# Patient Record
Sex: Female | Born: 1975 | Race: White | Hispanic: No | Marital: Single | Smoking: Never smoker
Health system: Southern US, Community
[De-identification: ages and names within clinical notes are randomized; demographics above are authoritative.]

## PROBLEM LIST (undated history)

## (undated) DIAGNOSIS — J45909 Unspecified asthma, uncomplicated: Secondary | ICD-10-CM

## (undated) HISTORY — PX: HEEL SPUR SURGERY: SHX665

## (undated) HISTORY — PX: TONSILLECTOMY: SUR1361

## (undated) HISTORY — PX: ABDOMINAL HYSTERECTOMY: SHX81

---

## 2012-09-28 ENCOUNTER — Ambulatory Visit: Payer: Self-pay | Admitting: Family Medicine

## 2017-07-22 ENCOUNTER — Emergency Department: Payer: BLUE CROSS/BLUE SHIELD

## 2017-07-22 ENCOUNTER — Other Ambulatory Visit: Payer: Self-pay

## 2017-07-22 ENCOUNTER — Emergency Department
Admission: EM | Admit: 2017-07-22 | Discharge: 2017-07-22 | Disposition: A | Discharge status: Home / Self Care | Payer: BLUE CROSS/BLUE SHIELD | Attending: Emergency Medicine | Admitting: Emergency Medicine

## 2017-07-22 ENCOUNTER — Encounter: Payer: Self-pay | Admitting: Emergency Medicine

## 2017-07-22 DIAGNOSIS — N200 Calculus of kidney: Secondary | ICD-10-CM | POA: Insufficient documentation

## 2017-07-22 DIAGNOSIS — J45909 Unspecified asthma, uncomplicated: Secondary | ICD-10-CM | POA: Insufficient documentation

## 2017-07-22 DIAGNOSIS — R109 Unspecified abdominal pain: Secondary | ICD-10-CM

## 2017-07-22 DIAGNOSIS — N201 Calculus of ureter: Secondary | ICD-10-CM | POA: Insufficient documentation

## 2017-07-22 DIAGNOSIS — R1084 Generalized abdominal pain: Secondary | ICD-10-CM | POA: Diagnosis present

## 2017-07-22 HISTORY — DX: Unspecified asthma, uncomplicated: J45.909

## 2017-07-22 LAB — URINALYSIS, COMPLETE (UACMP) WITH MICROSCOPIC
BACTERIA UA: NONE SEEN
Bilirubin Urine: NEGATIVE
GLUCOSE, UA: NEGATIVE mg/dL
KETONES UR: 20 mg/dL — AB
LEUKOCYTES UA: NEGATIVE
NITRITE: NEGATIVE
PH: 5 (ref 5.0–8.0)
Protein, ur: 100 mg/dL — AB
Specific Gravity, Urine: 1.025 (ref 1.005–1.030)

## 2017-07-22 MED ORDER — ONDANSETRON 4 MG PO TBDP: 4.0000 mg | ORAL_TABLET | Freq: Three times a day (TID) | ORAL | 0 refills | Status: DC | PRN

## 2017-07-22 MED ORDER — KETOROLAC TROMETHAMINE 30 MG/ML IJ SOLN
15.0000 mg | INTRAMUSCULAR | Status: AC
  Administered 2017-07-22: 15 mg via INTRAVENOUS
  Filled 2017-07-22: qty 1

## 2017-07-22 MED ORDER — TAMSULOSIN HCL 0.4 MG PO CAPS: 0.4000 mg | ORAL_CAPSULE | Freq: Every day | ORAL | 0 refills | Status: DC

## 2017-07-22 MED ORDER — ONDANSETRON HCL 4 MG/2ML IJ SOLN
4.0000 mg | Freq: Once | INTRAMUSCULAR | Status: AC
  Administered 2017-07-22: 4 mg via INTRAVENOUS
  Filled 2017-07-22: qty 2

## 2017-07-22 MED ORDER — TAMSULOSIN HCL 0.4 MG PO CAPS
0.4000 mg | ORAL_CAPSULE | ORAL | Status: AC
  Administered 2017-07-22: 0.4 mg via ORAL
  Filled 2017-07-22: qty 1

## 2017-07-22 MED ORDER — ACETAMINOPHEN 325 MG PO TABS: 650.0000 mg | ORAL_TABLET | Freq: Four times a day (QID) | ORAL | 0 refills | Status: DC | PRN

## 2017-07-22 MED ORDER — SODIUM CHLORIDE 0.9 % IV BOLUS (SEPSIS)
1000.0000 mL | Freq: Once | INTRAVENOUS | Status: AC
  Administered 2017-07-22: 1000 mL via INTRAVENOUS

## 2017-07-22 MED ORDER — IBUPROFEN 200 MG PO TABS: 600.0000 mg | ORAL_TABLET | Freq: Four times a day (QID) | ORAL | 0 refills | Status: DC | PRN

## 2017-07-22 MED ORDER — TRAMADOL HCL 50 MG PO TABS: 50.0000 mg | ORAL_TABLET | Freq: Four times a day (QID) | ORAL | 0 refills | Status: DC | PRN

## 2017-07-22 NOTE — ED Notes (Addendum)
This RN to bedside due to patient calling out. Attempted to hook patient back up to fluids. Pt however states she has to go to the bathroom again. Pt noted to be continuing to moan and groan. Will return to hook back up to fluids.

## 2017-07-22 NOTE — ED Notes (Signed)
Pt returned from CT °

## 2017-07-22 NOTE — ED Notes (Signed)
D/C instructions reviewed by MD. Prescriptions reviewed by MD. Pt scans and results reviewed with MD due to patient asking MD to come to bedside with "pictures in hand". This RN at bedside for review of D/C instructions. Pt denies any comments/concerns at this time. Pt ambulatory to the lobby at this time.

## 2017-07-22 NOTE — ED Provider Notes (Signed)
[facility] Emergency Department Provider Note  ____________________________________________  Time seen: Approximately 8:29 AM  I have reviewed the triage vital signs and the nursing notes.   HISTORY  Chief Complaint Flank Pain    HPI Kristin Khan is a 42 y.o. female who complains of right flank pain, constant, waxing and waning, severe, radiating to right lower quadrant for the past 3 days. No aggravating or alleviating factors. Find a position of comfort. Associated with dysuria and frequency. Never had anything like this before.     Past Medical History:  Diagnosis Date  . Asthma      There are no active problems to display for this patient.    past surgical history includes tonsillectomy and hysterectomy   Prior to Admission medications   Medication Sig Start Date End Date Taking? Authorizing Provider  valACYclovir (VALTREX) 1000 MG tablet Take 1 tablet by mouth daily. 07/15/17  Yes [provider]  acetaminophen (TYLENOL) 325 MG tablet Take 2 tablets (650 mg total) by mouth every 6 (six) hours as needed. 07/22/17   Sharman Cheek, MD  ibuprofen (MOTRIN IB) 200 MG tablet Take 3 tablets (600 mg total) by mouth every 6 (six) hours as needed. 07/22/17   Sharman Cheek, MD  tamsulosin (FLOMAX) 0.4 MG CAPS capsule Take 1 capsule (0.4 mg total) by mouth daily. 07/22/17   Sharman Cheek, MD  traMADol (ULTRAM) 50 MG tablet Take 1 tablet (50 mg total) by mouth every 6 (six) hours as needed. 07/22/17   Sharman Cheek, MD  none   Allergies Other and Oxycodone   No family history on file.  Social History Social History   Tobacco Use  . Smoking status: Never Smoker  . Smokeless tobacco: Never Used  Substance Use Topics  . Alcohol use: Not Currently  . Drug use: Never    Review of Systems  Constitutional:   No fever or chills.  Cardiovascular:   No chest pain or syncope. Respiratory:   No dyspnea or  cough. Gastrointestinal:   Negative for abdominal pain, vomiting and diarrhea.  Musculoskeletal:   Negative for focal pain or swelling All other systems reviewed and are negative except as documented above in ROS and HPI.  ____________________________________________   PHYSICAL EXAM:  VITAL SIGNS: ED Triage Vitals  Enc Vitals Group     BP 07/22/17 0805 (!) 144/87     Pulse Rate 07/22/17 0805 64     Resp 07/22/17 0805 20     Temp 07/22/17 0805 97.7 F (36.5 C)     Temp Source 07/22/17 0805 Oral     SpO2 07/22/17 0801 98 %     Weight 07/22/17 0807 206 lb (93.4 kg)     Height 07/22/17 0807 5\' 7"  (1.702 m)     Head Circumference --      Peak Flow --      Pain Score 07/22/17 0806 9     Pain Loc --      Pain Edu? --      Excl. in GC? --     Vital signs reviewed, nursing assessments reviewed.   Constitutional:   Alert and oriented. uncomfortable, not in distress Eyes:   No scleral icterus.  EOMI.No conjunctival pallor. PERRL. ENT   Head:   Normocephalic and atraumatic.   Nose:   No congestion/rhinnorhea.    Mouth/Throat:   MMM, no pharyngeal erythema. No peritonsillar mass.    Neck:   No meningismus. Full ROM. Hematological/Lymphatic/Immunilogical:   No cervical lymphadenopathy.  Cardiovascular:   RRR. Symmetric bilateral radial and DP pulses.  No murmurs.  Respiratory:   Normal respiratory effort without tachypnea/retractions. Breath sounds are clear and equal bilaterally. No wheezes/rales/rhonchi. Gastrointestinal:   Soft with right lower quadrant tenderness, not localized to McBurney's point. Non distended. There is no CVA tenderness.  No rebound, rigidity, or guarding. Genitourinary:   deferred Musculoskeletal:   Normal range of motion in all extremities. No joint effusions.  No lower extremity tenderness.  No edema. Neurologic:   Normal speech and language.  Motor grossly intact. No acute focal neurologic deficits are appreciated.  Skin:    Skin is warm, dry  and intact. No rash noted.  No petechiae, purpura, or bullae.  ____________________________________________    LABS (pertinent positives/negatives) (all labs ordered are listed, but only abnormal results are displayed) Labs Reviewed  URINALYSIS, COMPLETE (UACMP) WITH MICROSCOPIC - Abnormal; Notable for the following components:      Result Value   Color, Urine YELLOW (*)    APPearance CLOUDY (*)    Hgb urine dipstick SMALL (*)    Ketones, ur 20 (*)    Protein, ur 100 (*)    Squamous Epithelial / LPF 0-5 (*)    All other components within normal limits   ____________________________________________   EKG    ____________________________________________    RADIOLOGY  Ct Renal Stone Study  Result Date: 07/22/2017 CLINICAL DATA:  42 year old female with 3 days of right flank pain radiating to the right lower quadrant. Burning with urination. EXAM: CT ABDOMEN AND PELVIS WITHOUT CONTRAST TECHNIQUE: Multidetector CT imaging of the abdomen and pelvis was performed following the standard protocol without IV contrast. COMPARISON:  None. FINDINGS: Lower chest: Normal lung bases.  No pericardial or pleural effusion. Hepatobiliary: Negative noncontrast liver and gallbladder. Pancreas: Negative. Spleen: Negative. Adrenals/Urinary Tract: Normal adrenal glands. Hyperdense left renal pyramids but no left nephrolithiasis. No left hydronephrosis. Diminutive left ureter to the bladder. Moderate right hydronephrosis. Right perinephric stranding. 6 millimeter right renal midpole calculus. Right hydroureter with periureteral stranding. The right ureter remains abnormal to the ureterovesical junction, and there is asymmetric soft tissue stranding about the distal right ureter and the right ureterovesical junction on series 2, images 82 through 87. Just inside the urinary bladder there is a 3-4 millimeter calculus. The bladder is decompressed with asymmetric wall thickening especially along the junction with  the right ureter. Stomach/Bowel: Decompressed distal large bowel, descending colon. Largely decompressed transverse colon. Negative right colon. Normal appendix. No dilated small bowel. Decompressed stomach and duodenum. No abdominal free air or free fluid. Vascular/Lymphatic: Vascular patency is not evaluated in the absence of IV contrast. No lymphadenopathy. Reproductive: Surgically absent uterus. A 3.2 centimeter rounded mass of the right ovary has simple fluid density and is most likely a large physiologic cyst. The left ovary appears normal on series 2, image 75. Other: Small calcified left posterior flank injection granuloma (series 2, image 74), inconsequential. Musculoskeletal: No osseous abnormality identified. IMPRESSION: 1. Acute obstructive uropathy on the right. A 3-4 mm calculus appears to be lodged at the right UVJ with associated bladder wall edema and abundant regional inflammatory stranding along the distal right ureter. Moderate right hydronephrosis and hydroureter. 2. Superimposed 6 mm calculus within the right renal midpole. No left nephrolithiasis. 3. Normal appendix.  Simple right ovarian cyst. Electronically Signed   By: Odessa FlemingH  Hall M.D.   On: 07/22/2017 09:39    ____________________________________________   PROCEDURES Procedures  ____________________________________________  DIFFERENTIAL DIAGNOSIS   ureteral colic, cystitis,  pyelonephritis, appendicitis  CLINICAL IMPRESSION / ASSESSMENT AND PLAN / ED COURSE  Pertinent labs & imaging results that were available during my care of the patient were reviewed by me and considered in my medical decision making (see chart for details).   patient presents with severe right flank and right lower quadrant pain. I'll give Zofran Toradol and IV fluids for symptom relief. Check labs, CT scan for further evaluation.  Clinical Course as of Jul 22 1309  Thu Jul 22, 2017  1214 UA not c/w infection. Will manage symptomatically. Flomax,  nsaids, zofran. Outpatient follow up.    [PS]    Clinical Course User Index [PS] Sharman Cheek, MD     ____________________________________________   FINAL CLINICAL IMPRESSION(S) / ED DIAGNOSES    Final diagnoses:  Right flank pain  Kidney stone  Ureterolithiasis     ED Discharge Orders        Ordered    acetaminophen (TYLENOL) 325 MG tablet  Every 6 hours PRN     07/22/17 1254    ibuprofen (MOTRIN IB) 200 MG tablet  Every 6 hours PRN     07/22/17 1254    traMADol (ULTRAM) 50 MG tablet  Every 6 hours PRN     07/22/17 1254    tamsulosin (FLOMAX) 0.4 MG CAPS capsule  Daily     07/22/17 1255      Portions of this note were generated with dragon dictation software. Dictation errors may occur despite best attempts at proofreading.    Sharman Cheek, MD 07/22/17 1315

## 2017-07-22 NOTE — ED Notes (Signed)
Pt noted to be crawling on floor during MD assessment.

## 2017-07-22 NOTE — ED Notes (Signed)
Pt given water at this time. Will continue to monitor for further patient needs.

## 2017-07-22 NOTE — ED Notes (Addendum)
NA student at bedside due to patient calling out. Pt given a cup of water per her request.

## 2017-07-22 NOTE — ED Triage Notes (Signed)
Pt presents to ED via [facility] with c/o R flank pain x 3 days that radiates to R lower quadrant in her abdomen. Pt reports burning with urination and increased urination at this time. Pt presents to ED constantly moaning and groaning and repeatedly asking for pain medication, this RN explained would have to be evaluated by MD. VSS upon arrival to ED.

## 2017-07-22 NOTE — Discharge Instructions (Signed)
Your CT scan shows a 4mm stone just outside the bladder on your right side. Take flomax for the next week and pain medications as prescribed to control your symptoms.

## 2017-07-22 NOTE — ED Notes (Signed)
Pt given meal tray, states that she feels too nauseous too eat, pt is however noted to be tolerating PO fluids without difficulty. Pt lights dimmed for patient comfort. Will continue to monitor for further patient needs.

## 2019-11-15 IMAGING — CT CT RENAL STONE PROTOCOL
2 of 4 series · 16 of 46 positions shown, 18 images · non-contrast
Comparison: None.

CLINICAL DATA: 41-year-old female with 3 days of right flank pain
radiating to the right lower quadrant. Burning with urination.

EXAM:
CT ABDOMEN AND PELVIS WITHOUT CONTRAST
TECHNIQUE: Multidetector CT imaging of the abdomen and pelvis was performed
following the standard protocol without IV contrast.

[Series 2: stone full standard · axial · 0.83mm/px · z∈[-826,-366]mm · 13 of 102 slices shown, 15 images]
[im 5/102  soft-tissue]
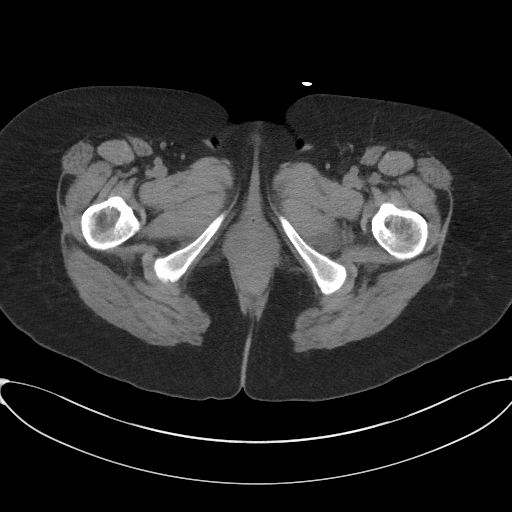
[im 5/102  bone]
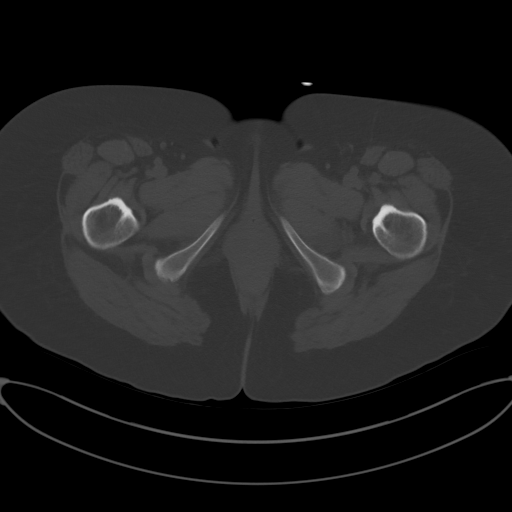
[im 13/102  soft-tissue]
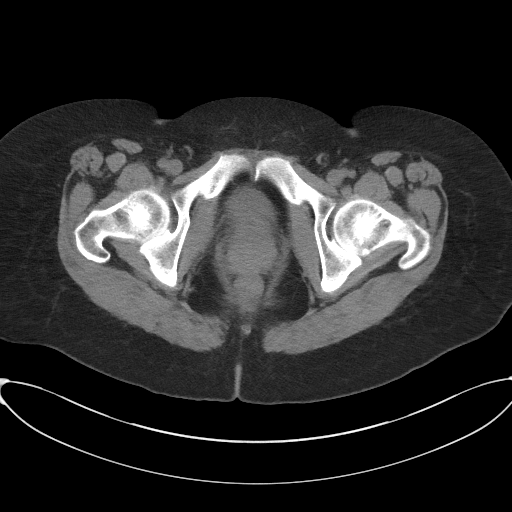
[im 22/102  soft-tissue]
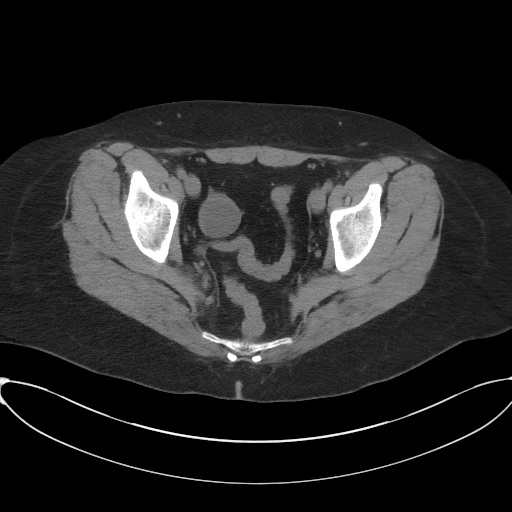
[im 30/102  soft-tissue]
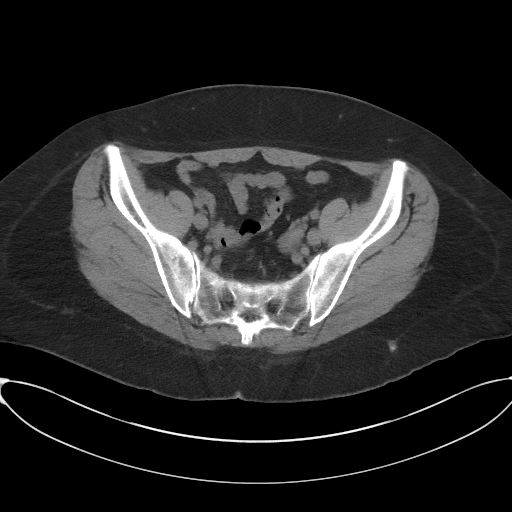
[im 34/102  soft-tissue]
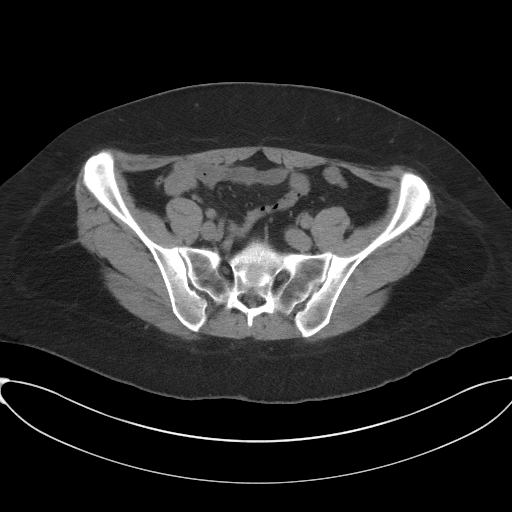
[im 43/102  soft-tissue]
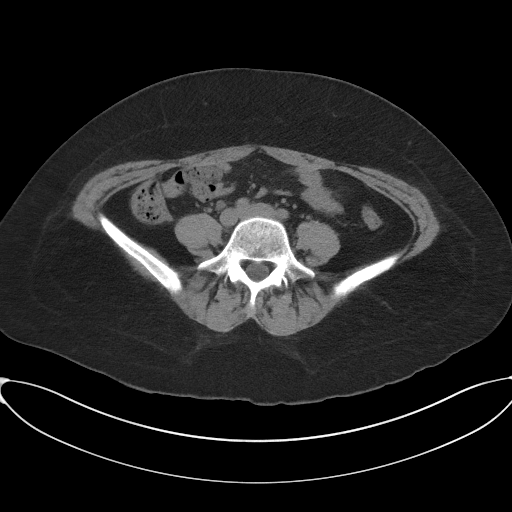
[im 51/102  soft-tissue]
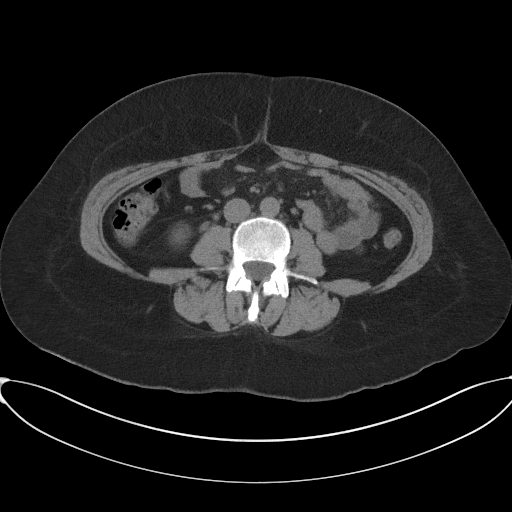
[im 59/102  soft-tissue]
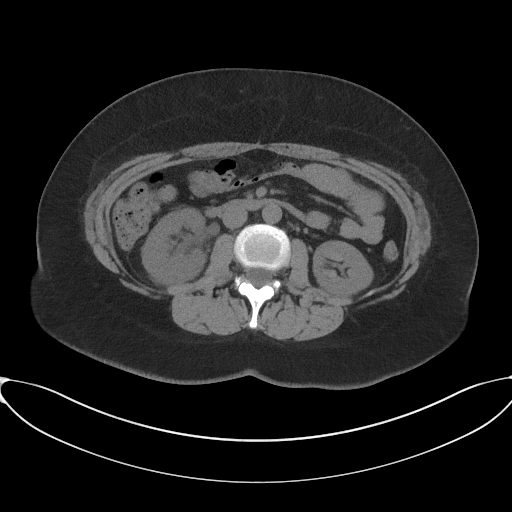
[im 68/102  soft-tissue]
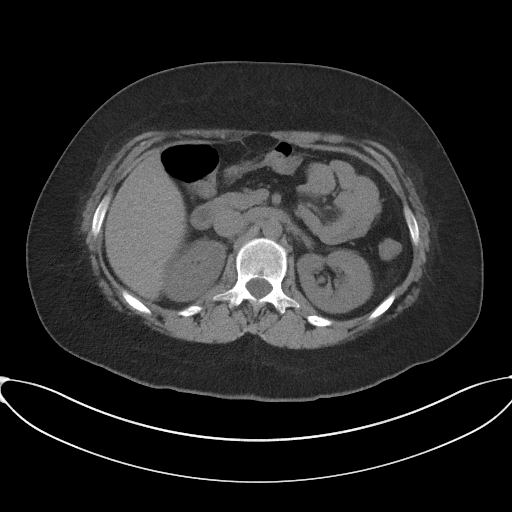
[im 68/102  bone]
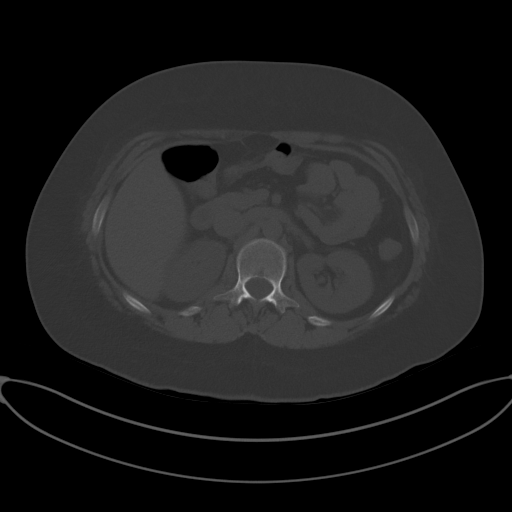
[im 72/102  soft-tissue]
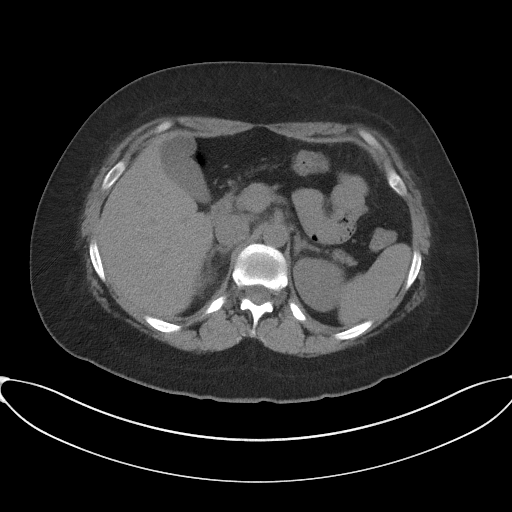
[im 80/102  soft-tissue]
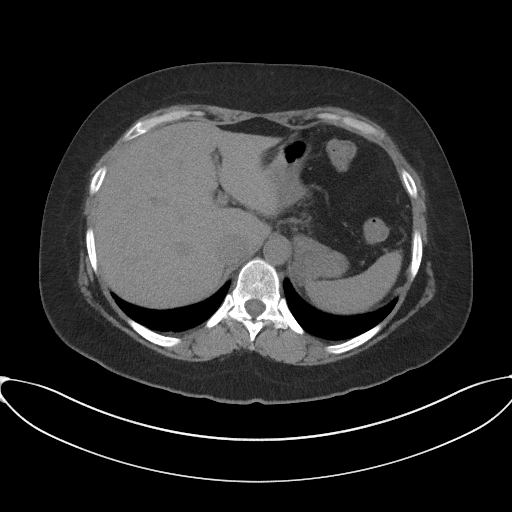
[im 89/102  soft-tissue]
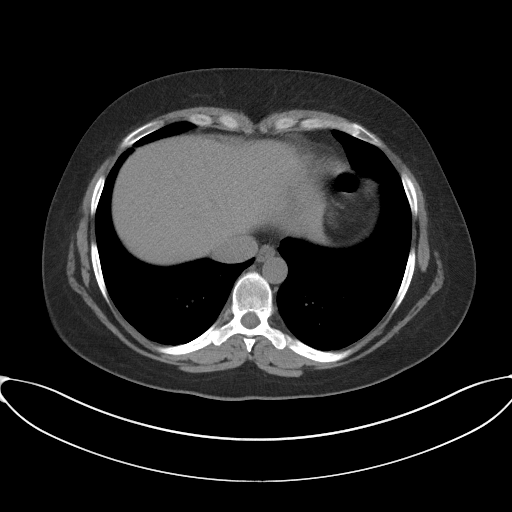
[im 97/102  soft-tissue]
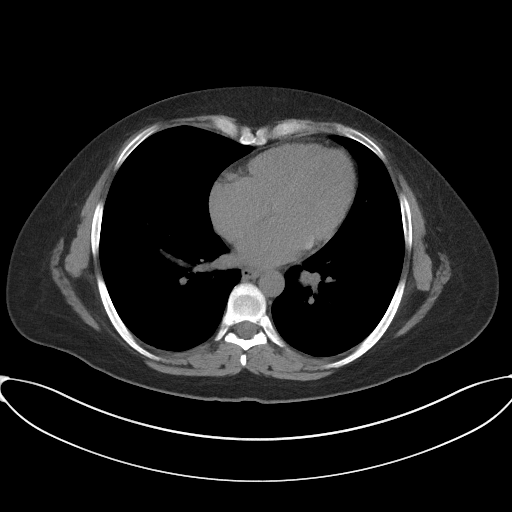

[Series 5: coronal · coronal · 0.87mm/px · 3 of 130 slices shown]
[im 44/130  soft-tissue]
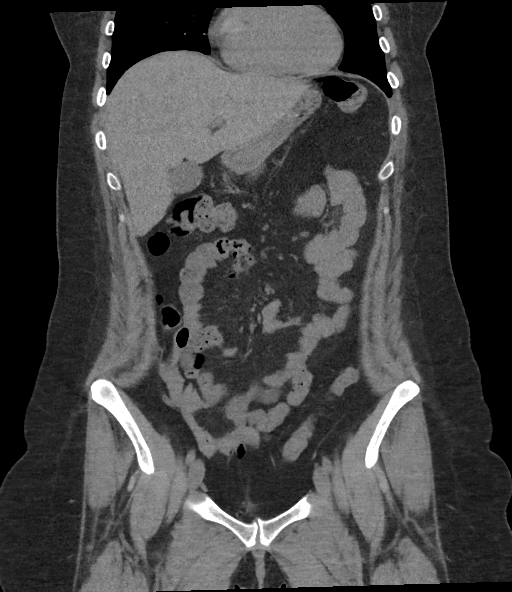
[im 58/130  soft-tissue]
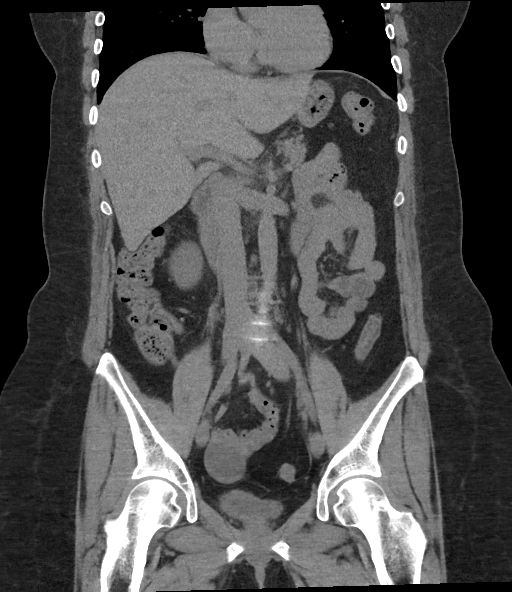
[im 72/130  soft-tissue]
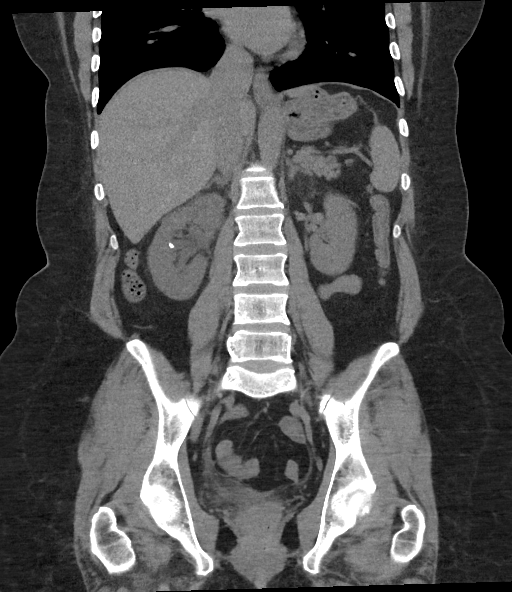

[16 of 46 positions shown; findings below may reference images not displayed]

FINDINGS: Lower chest: Normal lung bases.  No pericardial or pleural effusion.

Hepatobiliary: Negative noncontrast liver and gallbladder.

Pancreas: Negative.

Spleen: Negative.

Adrenals/Urinary Tract: Normal adrenal glands.

Hyperdense left renal pyramids but no left nephrolithiasis. No left
hydronephrosis. Diminutive left ureter to the bladder.

Moderate right hydronephrosis. Right perinephric stranding. 6
millimeter right renal midpole calculus. Right hydroureter with
periureteral stranding. The right ureter remains abnormal to the
ureterovesical junction, and there is asymmetric soft tissue
stranding about the distal right ureter and the right ureterovesical
junction on series 2, images 82 through 87. Just inside the urinary
bladder there is a 3-4 millimeter calculus. The bladder is
decompressed with asymmetric wall thickening especially along the
junction with the right ureter.

Stomach/Bowel: Decompressed distal large bowel, descending colon.
Largely decompressed transverse colon. Negative right colon. Normal
appendix. No dilated small bowel. Decompressed stomach and duodenum.

No abdominal free air or free fluid.

Vascular/Lymphatic: Vascular patency is not evaluated in the absence
of IV contrast. No lymphadenopathy.

Reproductive: Surgically absent uterus. A 3.2 centimeter rounded
mass of the right ovary has simple fluid density and is most likely
a large physiologic cyst. The left ovary appears normal on series 2,
image 75.

Other: Small calcified left posterior flank injection granuloma
(series 2, image 74), inconsequential.

Musculoskeletal: No osseous abnormality identified.
IMPRESSION: 1. Acute obstructive uropathy on the right. A 3-4 mm calculus
appears to be lodged at the right UVJ with associated bladder wall
edema and abundant regional inflammatory stranding along the distal
right ureter. Moderate right hydronephrosis and hydroureter.
2. Superimposed 6 mm calculus within the right renal midpole. No
left nephrolithiasis.
3. Normal appendix.  Simple right ovarian cyst.

## 2022-10-23 ENCOUNTER — Ambulatory Visit
Admission: EM | Admit: 2022-10-23 | Discharge: 2022-10-23 | Disposition: A | Discharge status: Home / Self Care | Payer: Managed Care, Other (non HMO) | Attending: Emergency Medicine | Admitting: Emergency Medicine

## 2022-10-23 ENCOUNTER — Encounter: Payer: Self-pay | Admitting: Emergency Medicine

## 2022-10-23 ENCOUNTER — Ambulatory Visit: Payer: Self-pay

## 2022-10-23 DIAGNOSIS — L247 Irritant contact dermatitis due to plants, except food: Secondary | ICD-10-CM

## 2022-10-23 MED ORDER — MUPIROCIN 2 % EX OINT: 1.0000 | TOPICAL_OINTMENT | Freq: Two times a day (BID) | CUTANEOUS | 0 refills | Status: AC

## 2022-10-23 MED ORDER — PREDNISONE 10 MG PO TABS: ORAL_TABLET | ORAL | 0 refills | Status: DC

## 2022-10-23 MED ORDER — HYDROXYZINE HCL 25 MG PO TABS: 25.0000 mg | ORAL_TABLET | Freq: Four times a day (QID) | ORAL | 0 refills | Status: AC | PRN

## 2022-10-23 NOTE — ED Provider Notes (Signed)
HPI  SUBJECTIVE:  Kristin Khan is a 47 y.o. female who presents with 1.5 weeks of an intensely pruritic, erythematous rash on her bilateral forearms, angle of left jaw and abdomen after spreading some dyed mulch.  She does not recall coming into contact with poison ivy or poison oak.  Denies pain, burning sensation, crusting, fevers.  She reports increased temperature and localized edema.  She tried aloe vera, which made her symptoms worse.  She also tried calamine and Benadryl.  No alleviating factors.  She has no past medical history.  LMP: Status post hysterectomy.  PCP: None.    Past Medical History:  Diagnosis Date   Asthma     Past Surgical History:  Procedure Laterality Date   ABDOMINAL HYSTERECTOMY     HEEL SPUR SURGERY     TONSILLECTOMY      History reviewed. No pertinent family history.  Social History   Tobacco Use   Smoking status: Never   Smokeless tobacco: Never  Vaping Use   Vaping Use: Never used  Substance Use Topics   Alcohol use: Not Currently   Drug use: Never    No current facility-administered medications for this encounter.  Current Outpatient Medications:    hydrOXYzine (ATARAX) 25 MG tablet, Take 1 tablet (25 mg total) by mouth every 6 (six) hours as needed for up to 10 days for itching., Disp: 30 tablet, Rfl: 0   mupirocin ointment (BACTROBAN) 2 %, Apply 1 Application topically 2 (two) times daily., Disp: 22 g, Rfl: 0   predniSONE (DELTASONE) 10 MG tablet, 6 tabs on day 1-2, 5 tabs on day 3-4, 4 tabs on day 5-6, 3 tabs on day 7-8, 2 tabs day 9-10, 1 tab day 11-12, Disp: 42 tablet, Rfl: 0   valACYclovir (VALTREX) 1000 MG tablet, Take 1 tablet by mouth daily., Disp: , Rfl:   Allergies  Allergen Reactions   Other Other (See Comments) and Rash    Dust - allergic rhinitis Mold - allergic rhinitis base metal bleach    Oxycodone Nausea And Vomiting     ROS  As noted in HPI.   Physical Exam  BP 133/79 (BP Location: Right Arm)   Pulse 69    Temp 98.4 F (36.9 C) (Oral)   Resp 16   SpO2 98%   Constitutional: Well developed, well nourished, no acute distress Eyes:  EOMI, conjunctiva normal bilaterally HENT: Normocephalic, atraumatic,mucus membranes moist Respiratory: Normal inspiratory effort Cardiovascular: Normal rate GI: nondistended skin: Tender, erythematous, blanchable flat rash on bilateral forearms, angle of left jaw.  Positive excoriations on forearms.  Dry skin in the left elbow flexor.  No crusting.  No purulent drainage.              Musculoskeletal: no deformities Neurologic: Alert & oriented x 3, no focal neuro deficits Psychiatric: Speech and behavior appropriate   ED Course   Medications - No data to display  No orders of the defined types were placed in this encounter.   No results found for this or any previous visit (from the past 24 hour(s)). No results found.  ED Clinical Impression  1. Irritant contact dermatitis due to plants, except food      ED Assessment/Plan     Patient presents with a contact dermatitis either from poison ivy or from the dye in the mulch.  Will send home with 12 days of prednisone, Bactroban, she is to start Claritin or Zyrtec, and if that does not work for itching, then Atarax.  Domeboro or oatmeal bath soaks.  Patient declined assistance in finding a PCP or primary care list.  Discussed MDM, treatment plan, and plan for follow-up with patient.  patient agrees with plan.   Meds ordered this encounter  Medications   hydrOXYzine (ATARAX) 25 MG tablet    Sig: Take 1 tablet (25 mg total) by mouth every 6 (six) hours as needed for up to 10 days for itching.    Dispense:  30 tablet    Refill:  0   predniSONE (DELTASONE) 10 MG tablet    Sig: 6 tabs on day 1-2, 5 tabs on day 3-4, 4 tabs on day 5-6, 3 tabs on day 7-8, 2 tabs day 9-10, 1 tab day 11-12    Dispense:  42 tablet    Refill:  0   mupirocin ointment (BACTROBAN) 2 %    Sig: Apply 1 Application  topically 2 (two) times daily.    Dispense:  22 g    Refill:  0      *This clinic note was created using Scientist, clinical (histocompatibility and immunogenetics). Therefore, there may be occasional mistakes despite careful proofreading.  ?    Domenick Gong, MD 10/23/22 1451

## 2022-10-23 NOTE — ED Triage Notes (Signed)
Pt presents with a rash on bilateral arms and chest for almost 2 weeks.

## 2022-10-23 NOTE — Discharge Instructions (Signed)
Use TecNu before going out in areas with known poison ivy/oak.  This will help prevent you from getting poison ivy/oak.  If you get a rash, you can use Zanfel or TecNu extreme to deactivate the oil, which will stop the rash from spreading and help with the itching.  Apply the Bactroban on scabbed areas to help prevent infection.  If you were given steroids, make sure you finish all of them.  You may take Claritin or Zyrtec.  If this does not work, then try the Atarax.  Dissolve 1 packet (or tablet) of Domeboro (aluminum acetate) in 1 pint of luke-warm water. Soak the affected areas with luke-warm Domeboro solution for 5-10 minutes twice daily. You may use apply gauze soaked in the domeboro.  Gently pat dry, Then apply the antibiotic cream. You may also take oatmeal baths with Aveeno oatmeal (1 cup in half full bathtub) or cornstarch/baking soda (1 cup each in half full bathtub). To prevent the oatmeal from caking in pipes, place it in a tied sock before dropping it into the bathtub.  Go to www.goodrx.com to look up your medications. This will give you a list of where you can find your prescriptions at the most affordable prices. Or ask the pharmacist what the cash price is, or if they have any other discount programs available to help make your medication more affordable. This can be less expensive than what you would pay with insurance.

## 2022-11-16 ENCOUNTER — Ambulatory Visit: Payer: Self-pay

## 2023-11-01 ENCOUNTER — Ambulatory Visit
Admission: EM | Admit: 2023-11-01 | Discharge: 2023-11-01 | Disposition: A | Discharge status: Home / Self Care | Attending: Physician Assistant | Admitting: Physician Assistant

## 2023-11-01 ENCOUNTER — Ambulatory Visit: Payer: Self-pay

## 2023-11-01 DIAGNOSIS — L237 Allergic contact dermatitis due to plants, except food: Secondary | ICD-10-CM | POA: Diagnosis not present

## 2023-11-01 MED ORDER — PREDNISONE 10 MG PO TABS: ORAL_TABLET | ORAL | 0 refills | Status: AC

## 2023-11-01 MED ORDER — TRIAMCINOLONE ACETONIDE 0.1 % EX CREA: 1.0000 | TOPICAL_CREAM | Freq: Two times a day (BID) | CUTANEOUS | 0 refills | Status: AC | PRN

## 2023-11-01 NOTE — ED Triage Notes (Signed)
 Poison ivy on arms and under arms x 5 days.

## 2023-11-01 NOTE — ED Provider Notes (Signed)
 [facility]    CSN: [id number] Arrival date & time: 11/01/23  1057      History   Chief Complaint Chief Complaint  Patient presents with   Poison Ivy    HPI Kristin Khan is a 48 y.o. female presenting for poison ivy rash all over her arms and neck.  Rash has been present for the last several days.  No improvement with topical corticosteroids and Benadryl.  Patient reports similar reaction last summer and received 12-day prednisone  taper which was helpful.  Denies fever or pain.  HPI  Past Medical History:  Diagnosis Date   Asthma     There are no active problems to display for this patient.   Past Surgical History:  Procedure Laterality Date   ABDOMINAL HYSTERECTOMY     HEEL SPUR SURGERY     TONSILLECTOMY      OB History   No obstetric history on file.      Home Medications    Prior to Admission medications   Medication Sig Start Date End Date Taking? Authorizing Provider  predniSONE  (DELTASONE ) 10 MG tablet Take 6 tabs po x 2 days, 5 tabs x 2 days, 4 tabs x 2 days, 3 tabs x 2 days, 2 tabs x 2 days, 1 tab x 2 days 11/01/23  Yes Arvis Huxley B, PA-C  triamcinolone cream (KENALOG) 0.1 % Apply 1 Application topically 2 (two) times daily as needed (rash). 11/01/23  Yes Arvis Huxley B, PA-C  mupirocin  ointment (BACTROBAN ) 2 % Apply 1 Application topically 2 (two) times daily. 10/23/22   Mortenson, Ashley, MD  valACYclovir (VALTREX) 1000 MG tablet Take 1 tablet by mouth daily. 07/15/17   [provider]    Family History History reviewed. No pertinent family history.  Social History Social History   Tobacco Use   Smoking status: Never   Smokeless tobacco: Never  Vaping Use   Vaping status: Never Used  Substance Use Topics   Alcohol use: Not Currently   Drug use: Never     Allergies   Other and Oxycodone   Review of Systems Review of Systems  Constitutional:  Negative for fatigue and fever.  Musculoskeletal:  Negative for  arthralgias and joint swelling.  Skin:  Positive for rash.     Physical Exam Triage Vital Signs ED Triage Vitals [11/01/23 1134]  Encounter Vitals Group     BP 129/88     Girls Systolic BP Percentile      Girls Diastolic BP Percentile      Boys Systolic BP Percentile      Boys Diastolic BP Percentile      Pulse Rate (!) 55     Resp 16     Temp 98.1 F (36.7 C)     Temp Source Oral     SpO2 98 %     Weight      Height      Head Circumference      Peak Flow      Pain Score 0     Pain Loc      Pain Education      Exclude from Growth Chart    No data found.  Updated Vital Signs BP 129/88 (BP Location: Left Arm)   Pulse (!) 55   Temp 98.1 F (36.7 C) (Oral)   Resp 16   SpO2 98%   Physical Exam Vitals and nursing note reviewed.  Constitutional:      General: She is not in acute distress.  Appearance: Normal appearance. She is not ill-appearing or toxic-appearing.  HENT:     Head: Normocephalic and atraumatic.   Eyes:     General: No scleral icterus.       Right eye: No discharge.        Left eye: No discharge.     Conjunctiva/sclera: Conjunctivae normal.    Cardiovascular:     Rate and Rhythm: Bradycardia present.  Pulmonary:     Effort: Pulmonary effort is normal. No respiratory distress.   Musculoskeletal:     Cervical back: Neck supple.   Skin:    General: Skin is dry.     Findings: Rash (erythematous vesicular rash widespread on bilateral arms and neck) present.   Neurological:     General: No focal deficit present.     Mental Status: She is alert. Mental status is at baseline.     Motor: No weakness.     Gait: Gait normal.   Psychiatric:        Mood and Affect: Mood normal.        Behavior: Behavior normal.      UC Treatments / Results  Labs (all labs ordered are listed, but only abnormal results are displayed) Labs Reviewed - No data to display  EKG   Radiology No results found.  Procedures Procedures (including critical  care time)  Medications Ordered in UC Medications - No data to display  Initial Impression / Assessment and Plan / UC Course  I have reviewed the triage vital signs and the nursing notes.  Pertinent labs & imaging results that were available during my care of the patient were reviewed by me and considered in my medical decision making (see chart for details).   48 year old female presents for rash of both arms due to poison ivy for the past several days.  No relief with OTC treatments.  Similar reaction last year and received prednisone  which she would like to try again.  Contact dermatitis due to poisonous plant.  Sent prednisone  taper and topical triamcinolone.  Reviewed continuing antihistamines.  Reviewed return precautions.   Final Clinical Impressions(s) / UC Diagnoses   Final diagnoses:  Poison ivy dermatitis   Discharge Instructions   None    ED Prescriptions     Medication Sig Dispense Auth. Provider   predniSONE  (DELTASONE ) 10 MG tablet Take 6 tabs po x 2 days, 5 tabs x 2 days, 4 tabs x 2 days, 3 tabs x 2 days, 2 tabs x 2 days, 1 tab x 2 days 42 tablet Arvis Huxley B, PA-C   triamcinolone cream (KENALOG) 0.1 % Apply 1 Application topically 2 (two) times daily as needed (rash). 80 g Arvis Huxley [name]      PDMP not reviewed this encounter.   Arvis Huxley KATHEE, PA-C 11/01/23 1228
